# Patient Record
Sex: Male | Born: 1970 | Race: White | Hispanic: No | Marital: Single | Smoking: Never smoker
Health system: Southern US, Community
[De-identification: ages and names within clinical notes are randomized; demographics above are authoritative.]

## PROBLEM LIST (undated history)

## (undated) DIAGNOSIS — T7840XA Allergy, unspecified, initial encounter: Secondary | ICD-10-CM

## (undated) DIAGNOSIS — Z789 Other specified health status: Secondary | ICD-10-CM

## (undated) HISTORY — PX: WRIST SURGERY: SHX841

## (undated) HISTORY — DX: Allergy, unspecified, initial encounter: T78.40XA

---

## 1998-01-01 ENCOUNTER — Emergency Department (HOSPITAL_COMMUNITY): Admission: EM | Admit: 1998-01-01 | Discharge: 1998-01-01 | Payer: Self-pay | Admitting: Emergency Medicine

## 2000-07-19 ENCOUNTER — Emergency Department (HOSPITAL_COMMUNITY): Admission: EM | Admit: 2000-07-19 | Discharge: 2000-07-20 | Payer: Self-pay | Admitting: Emergency Medicine

## 2011-07-09 ENCOUNTER — Ambulatory Visit (INDEPENDENT_AMBULATORY_CARE_PROVIDER_SITE_OTHER): Payer: PRIVATE HEALTH INSURANCE

## 2011-07-09 DIAGNOSIS — F17200 Nicotine dependence, unspecified, uncomplicated: Secondary | ICD-10-CM

## 2011-07-09 DIAGNOSIS — R0602 Shortness of breath: Secondary | ICD-10-CM

## 2011-07-09 DIAGNOSIS — J159 Unspecified bacterial pneumonia: Secondary | ICD-10-CM

## 2011-07-15 ENCOUNTER — Ambulatory Visit (INDEPENDENT_AMBULATORY_CARE_PROVIDER_SITE_OTHER): Payer: PRIVATE HEALTH INSURANCE

## 2011-07-15 DIAGNOSIS — R059 Cough, unspecified: Secondary | ICD-10-CM

## 2011-07-15 DIAGNOSIS — J159 Unspecified bacterial pneumonia: Secondary | ICD-10-CM

## 2011-07-15 DIAGNOSIS — R05 Cough: Secondary | ICD-10-CM

## 2015-05-09 ENCOUNTER — Ambulatory Visit (INDEPENDENT_AMBULATORY_CARE_PROVIDER_SITE_OTHER): Payer: BLUE CROSS/BLUE SHIELD | Admitting: Family Medicine

## 2015-05-09 ENCOUNTER — Encounter: Payer: Self-pay | Admitting: Family Medicine

## 2015-05-09 VITALS — BP 108/66 | HR 74 | Temp 97.2°F | Ht 69.0 in | Wt 185.8 lb

## 2015-05-09 DIAGNOSIS — Z23 Encounter for immunization: Secondary | ICD-10-CM | POA: Diagnosis not present

## 2015-05-09 DIAGNOSIS — M545 Low back pain, unspecified: Secondary | ICD-10-CM

## 2015-05-09 MED ORDER — HYDROCODONE-ACETAMINOPHEN 5-325 MG PO TABS: 1.0000 | ORAL_TABLET | Freq: Four times a day (QID) | ORAL | Status: DC | PRN

## 2015-05-09 MED ORDER — CYCLOBENZAPRINE HCL 10 MG PO TABS: 10.0000 mg | ORAL_TABLET | Freq: Three times a day (TID) | ORAL | Status: DC | PRN

## 2015-05-09 NOTE — Patient Instructions (Signed)

## 2015-05-09 NOTE — Progress Notes (Signed)
   Subjective:    [name], male    DOB: Sep 25, 1970, 44 y.o.   MRN: [id number]  HPI 44 year old gentleman with neck pain. He felt pain when he turned several days ago and then the following day picked up a tool kit and felt worse pain. Pain is in his upper low back. There are no radicular symptoms or weakness in his legs. He has had similar episodes 3-5 times over the last 20 years.  There are no active problems to display for this patient.  Outpatient Encounter Prescriptions as of 05/09/2015  Medication Sig  . fluticasone (FLONASE) 50 MCG/ACT nasal spray Place 2 sprays into both nostrils daily.   No facility-administered encounter medications on file as of 05/09/2015.      Review of Systems  Constitutional: Negative.   HENT: Negative.   Respiratory: Negative.   Cardiovascular: Negative.   Gastrointestinal: Negative.   Musculoskeletal: Positive for back pain.       Objective:   Physical Exam  Constitutional: He appears well-developed and well-nourished.  Musculoskeletal:  Back: Decreased range of motion in all planes. Deep tendon reflexes are symmetric at the ankles and knees. Normal toe and heel gait.          Assessment & Plan:  1. Midline low back pain without sciatica Continue moving but avoid activities which aggravate pain. Besides, heat or ice, whichever improves symptoms. Prescriptions: Flexeril 10 mg 3 times a day (cautioned about drowsiness) and hydrocodone 10/25/2023 as needed. He will return if symptoms worsen or do not improve  Wardell Honour MD

## 2015-05-15 ENCOUNTER — Telehealth: Payer: Self-pay | Admitting: Family Medicine

## 2015-05-15 NOTE — Telephone Encounter (Signed)
Pt is aware Dr.Miller is out of the office until Wednesday morning.

## 2015-05-17 ENCOUNTER — Telehealth: Payer: Self-pay | Admitting: Family Medicine

## 2015-05-17 NOTE — Telephone Encounter (Signed)
Okay noted for 1 additional week. If no better needs to be re-checked

## 2015-05-17 NOTE — Telephone Encounter (Signed)
Patient tried going back to work on Monday 11/21 but was unable to stay.  Has been out of work all week.  Wants to know if he can have a work note for 05/15/15 through 05/19/15 and just go back next Monday.  If he's not any better at that time he will come back to see you.  Please advise.

## 2015-05-17 NOTE — Telephone Encounter (Signed)
Note placed up front per Dr. Hyacinth MeekerMiller, patient informed

## 2015-07-21 NOTE — Telephone Encounter (Signed)
Multiple attempts were made to reach pt. Will close encounter.

## 2015-11-10 ENCOUNTER — Ambulatory Visit (INDEPENDENT_AMBULATORY_CARE_PROVIDER_SITE_OTHER): Payer: BLUE CROSS/BLUE SHIELD

## 2015-11-10 ENCOUNTER — Encounter: Payer: Self-pay | Admitting: Physician Assistant

## 2015-11-10 ENCOUNTER — Ambulatory Visit (INDEPENDENT_AMBULATORY_CARE_PROVIDER_SITE_OTHER): Payer: BLUE CROSS/BLUE SHIELD | Admitting: Physician Assistant

## 2015-11-10 VITALS — BP 115/69 | HR 84 | Temp 97.6°F | Ht 69.0 in | Wt 186.4 lb

## 2015-11-10 DIAGNOSIS — M25571 Pain in right ankle and joints of right foot: Secondary | ICD-10-CM

## 2015-11-10 NOTE — Patient Instructions (Signed)
Ankle Sprain  An ankle sprain is an injury to the strong, fibrous tissues (ligaments) that hold the bones of your ankle joint together.   CAUSES  An ankle sprain is usually caused by a fall or by twisting your ankle. Ankle sprains most commonly occur when you step on the outer edge of your foot, and your ankle turns inward. People who participate in sports are more prone to these types of injuries.   SYMPTOMS    Pain in your ankle. The pain may be present at rest or only when you are trying to stand or walk.   Swelling.   Bruising. Bruising may develop immediately or within 1 to 2 days after your injury.   Difficulty standing or walking, particularly when turning corners or changing directions.  DIAGNOSIS   Your caregiver will ask you details about your injury and perform a physical exam of your ankle to determine if you have an ankle sprain. During the physical exam, your caregiver will press on and apply pressure to specific areas of your foot and ankle. Your caregiver will try to move your ankle in certain ways. An X-ray exam may be done to be sure a bone was not broken or a ligament did not separate from one of the bones in your ankle (avulsion fracture).   TREATMENT   Certain types of braces can help stabilize your ankle. Your caregiver can make a recommendation for this. Your caregiver may recommend the use of medicine for pain. If your sprain is severe, your caregiver may refer you to a surgeon who helps to restore function to parts of your skeletal system (orthopedist) or a physical therapist.  HOME CARE INSTRUCTIONS    Apply ice to your injury for 1-2 days or as directed by your caregiver. Applying ice helps to reduce inflammation and pain.    Put ice in a plastic bag.    Place a towel between your skin and the bag.    Leave the ice on for 15-20 minutes at a time, every 2 hours while you are awake.   Only take over-the-counter or prescription medicines for pain, discomfort, or fever as directed by  your caregiver.   Elevate your injured ankle above the level of your heart as much as possible for 2-3 days.   If your caregiver recommends crutches, use them as instructed. Gradually put weight on the affected ankle. Continue to use crutches or a cane until you can walk without feeling pain in your ankle.   If you have a plaster splint, wear the splint as directed by your caregiver. Do not rest it on anything harder than a pillow for the first 24 hours. Do not put weight on it. Do not get it wet. You may take it off to take a shower or bath.   You may have been given an elastic bandage to wear around your ankle to provide support. If the elastic bandage is too tight (you have numbness or tingling in your foot or your foot becomes cold and blue), adjust the bandage to make it comfortable.   If you have an air splint, you may blow more air into it or let air out to make it more comfortable. You may take your splint off at night and before taking a shower or bath. Wiggle your toes in the splint several times per day to decrease swelling.  SEEK MEDICAL CARE IF:    You have rapidly increasing bruising or swelling.   Your toes feel   extremely cold or you lose feeling in your foot.   Your pain is not relieved with medicine.  SEEK IMMEDIATE MEDICAL CARE IF:   Your toes are numb or blue.   You have severe pain that is increasing.  MAKE SURE YOU:    Understand these instructions.   Will watch your condition.   Will get help right away if you are not doing well or get worse.     This information is not intended to replace advice given to you by your health care provider. Make sure you discuss any questions you have with your health care provider.     Document Released: 06/10/2005 Document Revised: 07/01/2014 Document Reviewed: 06/22/2011  [employer]vier Interactive Patient Education 2016 [employer]
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
## 2015-11-14 ENCOUNTER — Other Ambulatory Visit: Payer: Self-pay | Admitting: *Deleted

## 2015-11-14 DIAGNOSIS — M25571 Pain in right ankle and joints of right foot: Secondary | ICD-10-CM

## 2015-11-15 ENCOUNTER — Telehealth: Payer: Self-pay | Admitting: Family Medicine

## 2015-11-15 ENCOUNTER — Encounter: Payer: Self-pay | Admitting: *Deleted

## 2015-11-15 NOTE — Telephone Encounter (Signed)
Left message on VM that note is at front desk ready for pickup, note states to be out until further testing has been completed. When results come in note will be rewritten.

## 2015-11-17 ENCOUNTER — Ambulatory Visit (HOSPITAL_COMMUNITY): Admission: RE | Admit: 2015-11-17 | Payer: BLUE CROSS/BLUE SHIELD | Source: Ambulatory Visit

## 2015-11-27 ENCOUNTER — Telehealth: Payer: Self-pay | Admitting: Physician Assistant

## 2015-11-27 NOTE — Telephone Encounter (Signed)
Ok to write note to return to work if pt desires

## 2015-11-27 NOTE — Telephone Encounter (Signed)
Patient aware that letter is ready for pick up.

## 2017-10-20 DIAGNOSIS — L700 Acne vulgaris: Secondary | ICD-10-CM | POA: Diagnosis not present

## 2018-05-08 ENCOUNTER — Ambulatory Visit: Payer: BLUE CROSS/BLUE SHIELD | Admitting: Family Medicine

## 2018-05-08 ENCOUNTER — Encounter: Payer: Self-pay | Admitting: Physician Assistant

## 2018-05-08 ENCOUNTER — Ambulatory Visit (INDEPENDENT_AMBULATORY_CARE_PROVIDER_SITE_OTHER): Payer: 59 | Admitting: Physician Assistant

## 2018-05-08 VITALS — BP 100/57 | HR 70 | Temp 98.0°F | Ht 69.0 in | Wt 196.0 lb

## 2018-05-08 DIAGNOSIS — S39012A Strain of muscle, fascia and tendon of lower back, initial encounter: Secondary | ICD-10-CM | POA: Diagnosis not present

## 2018-05-08 NOTE — Patient Instructions (Signed)

## 2018-05-08 NOTE — Progress Notes (Signed)
BP (!) 100/57   Pulse 70   Temp 98 F (36.7 C) (Oral)   Ht 5\' 9"  (1.753 m)   Wt 88.9 kg   BMI 28.94 kg/m    Subjective:    [name], male    DOB: June 25, 1970, 47 y.o.   MRN: [id number]  HPI: Steven Cherry is a 47 y.o. male presenting on 05/08/2018 for Back Pain   Back started 2 months ago as a dull aching in the lower back worse in the mornings and would improve throughout the day. Patient denies any injury or trauma to the area. Pain was bearable at that point. 4 days ago patient was moving boxes and felt his "back went out". He felt a sharp severe pain in the lower back and was unable to move due to the pain. 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 He says his back feels tight and has been trying to stretch daily. He has been taking ibuprofen which has been helping with the pain. The pain has improved over the last 2 days and patient is needing a doctors clearance to go back to work. He denies fevers, chest pain, sob, weakness, sensory changes, swelling, or bowel/bladder changes.    Relevant past medical, surgical, family and social history reviewed and updated as indicated. Interim medical history since our last visit reviewed. Allergies and medications reviewed and updated.  Review of Systems  Constitutional: Positive for activity change. Negative for chills and fever.  HENT: Negative.   Eyes: Negative.   Respiratory: Negative for shortness of breath.   Cardiovascular: Negative.  Negative for chest pain.  Gastrointestinal: Negative.   Genitourinary: Negative.   Musculoskeletal: Positive for back pain.  Skin: Negative.   Neurological: Negative for weakness and numbness.    Per HPI unless specifically indicated above   Allergies as of 05/08/2018   No Known Allergies     Medication List        Accurate as of 05/08/18  2:57 PM. Always use your most recent med list.          fluticasone 50 MCG/ACT  nasal spray Commonly known as:  FLONASE Place 2 sprays into both nostrils daily.          Objective:    BP (!) 100/57   Pulse 70   Temp 98 F (36.7 C) (Oral)   Ht 5\' 9"  (1.753 m)   Wt 88.9 kg   BMI 28.94 kg/m   Wt Readings from Last 3 Encounters:  05/08/18 88.9 kg  11/10/15 84.6 kg  05/09/15 84.3 kg    Physical Exam  Constitutional: He is oriented to person, place, and time. He appears well-developed and well-nourished. No distress.  HENT:  Head: Normocephalic and atraumatic.  Eyes: Conjunctivae are normal.  Cardiovascular: Normal rate, regular rhythm, normal heart sounds and intact distal pulses.  Pulmonary/Chest: Effort normal and breath sounds normal.  Musculoskeletal: He exhibits no edema, tenderness or deformity.  Neurological: He is alert and oriented to person, place, and time. No sensory deficit.  Psychiatric: He has a normal mood and affect.    No results found for this or any previous visit.    Assessment & Plan:   Problem List Items Addressed This Visit    None     Lower back strain Patient presents with 4 days of improving lower back pain. Patient was moving boxes Monday and felt his back  went out. 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 He has been taking ibuprofen for pain and doing stretches. I gave patient back exercise handout to perform daily. I recommend patient continue with ibuprofen as needed for inflammation. I will give a not for patient to start work again on Monday.   Follow up plan: No follow-ups on file.  Counseling provided for all of the vaccine components No orders of the defined types were placed in this encounter.   Prudy FeelerAngel Jones, PA-C [facility] 05/08/2018, 2:57 PM

## 2018-07-05 ENCOUNTER — Encounter (HOSPITAL_COMMUNITY): Payer: Self-pay | Admitting: Emergency Medicine

## 2018-07-05 ENCOUNTER — Emergency Department (HOSPITAL_COMMUNITY): Payer: 59

## 2018-07-05 ENCOUNTER — Emergency Department (HOSPITAL_COMMUNITY)
Admission: EM | Admit: 2018-07-05 | Discharge: 2018-07-05 | Disposition: A | Discharge status: Home / Self Care | Payer: 59 | Attending: Emergency Medicine | Admitting: Emergency Medicine

## 2018-07-05 ENCOUNTER — Other Ambulatory Visit: Payer: Self-pay

## 2018-07-05 DIAGNOSIS — Y929 Unspecified place or not applicable: Secondary | ICD-10-CM | POA: Insufficient documentation

## 2018-07-05 DIAGNOSIS — R109 Unspecified abdominal pain: Secondary | ICD-10-CM | POA: Diagnosis not present

## 2018-07-05 DIAGNOSIS — S3993XA Unspecified injury of pelvis, initial encounter: Secondary | ICD-10-CM | POA: Diagnosis not present

## 2018-07-05 DIAGNOSIS — S2242XA Multiple fractures of ribs, left side, initial encounter for closed fracture: Secondary | ICD-10-CM | POA: Diagnosis not present

## 2018-07-05 DIAGNOSIS — S3991XA Unspecified injury of abdomen, initial encounter: Secondary | ICD-10-CM | POA: Insufficient documentation

## 2018-07-05 DIAGNOSIS — Y9389 Activity, other specified: Secondary | ICD-10-CM | POA: Insufficient documentation

## 2018-07-05 DIAGNOSIS — S0990XA Unspecified injury of head, initial encounter: Secondary | ICD-10-CM | POA: Diagnosis not present

## 2018-07-05 DIAGNOSIS — S199XXA Unspecified injury of neck, initial encounter: Secondary | ICD-10-CM | POA: Diagnosis not present

## 2018-07-05 DIAGNOSIS — Y999 Unspecified external cause status: Secondary | ICD-10-CM | POA: Insufficient documentation

## 2018-07-05 DIAGNOSIS — S4992XA Unspecified injury of left shoulder and upper arm, initial encounter: Secondary | ICD-10-CM | POA: Diagnosis not present

## 2018-07-05 DIAGNOSIS — S299XXA Unspecified injury of thorax, initial encounter: Secondary | ICD-10-CM | POA: Diagnosis not present

## 2018-07-05 HISTORY — DX: Other specified health status: Z78.9

## 2018-07-05 LAB — COMPREHENSIVE METABOLIC PANEL
ALT: 28 U/L (ref 0–44)
AST: 28 U/L (ref 15–41)
Albumin: 3.8 g/dL (ref 3.5–5.0)
Alkaline Phosphatase: 28 U/L — ABNORMAL LOW (ref 38–126)
Anion gap: 9 (ref 5–15)
BILIRUBIN TOTAL: 1.1 mg/dL (ref 0.3–1.2)
BUN: 16 mg/dL (ref 6–20)
CO2: 23 mmol/L (ref 22–32)
Calcium: 9.2 mg/dL (ref 8.9–10.3)
Chloride: 108 mmol/L (ref 98–111)
Creatinine, Ser: 1.09 mg/dL (ref 0.61–1.24)
GFR calc Af Amer: >60 mL/min (ref >60)
Glucose, Bld: 88 mg/dL (ref 70–99)
Potassium: 3.5 mmol/L (ref 3.5–5.1)
Sodium: 140 mmol/L (ref 135–145)
Total Protein: 5.9 g/dL — ABNORMAL LOW (ref 6.5–8.1)

## 2018-07-05 LAB — I-STAT CHEM 8, ED
BUN: 19 mg/dL (ref 6–20)
Calcium, Ion: 1.12 mmol/L — ABNORMAL LOW (ref 1.15–1.40)
Chloride: 106 mmol/L (ref 98–111)
Creatinine, Ser: 1 mg/dL (ref 0.61–1.24)
Glucose, Bld: 84 mg/dL (ref 70–99)
HCT: 42 % (ref 39.0–52.0)
Hemoglobin: 14.3 g/dL (ref 13.0–17.0)
Potassium: 3.5 mmol/L (ref 3.5–5.1)
Sodium: 140 mmol/L (ref 135–145)
TCO2: 23 mmol/L (ref 22–32)

## 2018-07-05 LAB — URINALYSIS, ROUTINE W REFLEX MICROSCOPIC
Bilirubin Urine: NEGATIVE
Glucose, UA: NEGATIVE mg/dL
Hgb urine dipstick: NEGATIVE
Ketones, ur: NEGATIVE mg/dL
Leukocytes, UA: NEGATIVE
NITRITE: NEGATIVE
Protein, ur: NEGATIVE mg/dL
Specific Gravity, Urine: 1.024 (ref 1.005–1.030)
pH: 6 (ref 5.0–8.0)

## 2018-07-05 LAB — PROTIME-INR
INR: 0.93
Prothrombin Time: 12.4 seconds (ref 11.4–15.2)

## 2018-07-05 LAB — CBC
HCT: 44.8 % (ref 39.0–52.0)
Hemoglobin: 15.2 g/dL (ref 13.0–17.0)
MCH: 30.2 pg (ref 26.0–34.0)
MCHC: 33.9 g/dL (ref 30.0–36.0)
MCV: 89.1 fL (ref 80.0–100.0)
NRBC: 0 % (ref 0.0–0.2)
Platelets: 172 10*3/uL (ref 150–400)
RBC: 5.03 MIL/uL (ref 4.22–5.81)
RDW: 11.9 % (ref 11.5–15.5)
WBC: 8.6 10*3/uL (ref 4.0–10.5)

## 2018-07-05 LAB — SAMPLE TO BLOOD BANK

## 2018-07-05 LAB — ETHANOL: Alcohol, Ethyl (B): <10 mg/dL (ref <10)

## 2018-07-05 LAB — I-STAT CG4 LACTIC ACID, ED: Lactic Acid, Venous: 1.13 mmol/L (ref 0.5–1.9)

## 2018-07-05 LAB — CDS SEROLOGY

## 2018-07-05 MED ORDER — ACETAMINOPHEN 500 MG PO TABS
1000.0000 mg | ORAL_TABLET | Freq: Once | ORAL | Status: AC
  Administered 2018-07-05: 1000 mg via ORAL
  Filled 2018-07-05: qty 2

## 2018-07-05 MED ORDER — TETANUS-DIPHTH-ACELL PERTUSSIS 5-2.5-18.5 LF-MCG/0.5 IM SUSP
0.5000 mL | Freq: Once | INTRAMUSCULAR | Status: DC
  Filled 2018-07-05: qty 0.5

## 2018-07-05 MED ORDER — HYDROMORPHONE HCL 1 MG/ML IJ SOLN
1.0000 mg | Freq: Once | INTRAMUSCULAR | Status: AC
  Administered 2018-07-05: 1 mg via INTRAVENOUS
  Filled 2018-07-05: qty 1

## 2018-07-05 MED ORDER — IOHEXOL 300 MG/ML  SOLN
100.0000 mL | Freq: Once | INTRAMUSCULAR | Status: AC | PRN
  Administered 2018-07-05: 100 mL via INTRAVENOUS
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
## 2018-07-05 NOTE — Progress Notes (Signed)
Orthopedic Tech Progress Note Patient Details:  Steven Cherry [birth year] [id number]  [name], male   DOB: 06-Mar-1971, 48 y.o.   MRN: [id number]   [name] 07/05/2018, 5:28 PMLevel 2 Trauma.

## 2018-07-05 NOTE — ED Notes (Signed)
Patient transported to CT 
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
## 2018-07-05 NOTE — ED Notes (Signed)
Patient transported to X-ray 

## 2018-07-06 ENCOUNTER — Encounter: Payer: Self-pay | Admitting: Physician Assistant

## 2018-07-16 ENCOUNTER — Telehealth: Payer: Self-pay | Admitting: Family Medicine

## 2018-07-16 NOTE — Telephone Encounter (Signed)
appt scheduled Pt notified 

## 2018-07-17 ENCOUNTER — Ambulatory Visit (INDEPENDENT_AMBULATORY_CARE_PROVIDER_SITE_OTHER): Payer: 59 | Admitting: Family Medicine

## 2018-07-17 ENCOUNTER — Encounter: Payer: Self-pay | Admitting: Family Medicine

## 2018-07-17 VITALS — BP 122/81 | HR 74 | Temp 97.2°F | Ht 69.0 in | Wt 198.0 lb

## 2018-07-17 DIAGNOSIS — J342 Deviated nasal septum: Secondary | ICD-10-CM | POA: Insufficient documentation

## 2018-07-17 DIAGNOSIS — J3089 Other allergic rhinitis: Secondary | ICD-10-CM | POA: Diagnosis not present

## 2018-07-17 DIAGNOSIS — S2242XA Multiple fractures of ribs, left side, initial encounter for closed fracture: Secondary | ICD-10-CM | POA: Insufficient documentation

## 2018-07-17 DIAGNOSIS — M25512 Pain in left shoulder: Secondary | ICD-10-CM | POA: Diagnosis not present

## 2018-07-17 DIAGNOSIS — S2242XD Multiple fractures of ribs, left side, subsequent encounter for fracture with routine healing: Secondary | ICD-10-CM

## 2018-07-17 MED ORDER — CYCLOBENZAPRINE HCL 10 MG PO TABS: 10.0000 mg | ORAL_TABLET | Freq: Three times a day (TID) | ORAL | 1 refills | Status: DC | PRN

## 2018-07-17 MED ORDER — AZELASTINE HCL 0.1 % NA SOLN: 2.0000 | Freq: Two times a day (BID) | NASAL | 12 refills | Status: DC

## 2018-07-17 MED ORDER — DICLOFENAC SODIUM 75 MG PO TBEC: 75.0000 mg | DELAYED_RELEASE_TABLET | Freq: Two times a day (BID) | ORAL | 1 refills | Status: DC | PRN

## 2018-07-17 NOTE — Patient Instructions (Addendum)
I have referred you to [facility] in [place]. I have sent you in diclofenac that you may take twice daily with a meal and plenty of water for left shoulder pain and inflammation.  You have also been prescribed a muscle relaxer that you may use up to 3 times daily if needed.  Be cautious that this can cause sedation.  Do not operate heavy machinery with this medicine.  Plan to see me again in 1 month for recheck and possible release to work.  With regards to your allergy symptoms.  I recommend you obtain Xyzal and take 1 tablet every night at bedtime.  He may continue the Nasacort.  If you do not notice substantial improvement in your symptoms within the next week or so, he can hold the Nasacort and start the Astelin nasal spray that I have sent to the pharmacy for you.  If you have persistent symptoms despite these treatments, and decide that you want to go to the ear nose and throat for the deviated septum contact me and I will place the referral.
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
## 2018-08-03 DIAGNOSIS — M7582 Other shoulder lesions, left shoulder: Secondary | ICD-10-CM | POA: Diagnosis not present

## 2018-08-03 DIAGNOSIS — M5412 Radiculopathy, cervical region: Secondary | ICD-10-CM | POA: Diagnosis not present

## 2018-08-12 DIAGNOSIS — M25512 Pain in left shoulder: Secondary | ICD-10-CM | POA: Diagnosis not present

## 2018-08-12 DIAGNOSIS — M542 Cervicalgia: Secondary | ICD-10-CM | POA: Diagnosis not present

## 2018-08-14 DIAGNOSIS — M542 Cervicalgia: Secondary | ICD-10-CM | POA: Diagnosis not present

## 2018-08-14 DIAGNOSIS — M25512 Pain in left shoulder: Secondary | ICD-10-CM | POA: Diagnosis not present

## 2018-08-17 ENCOUNTER — Ambulatory Visit: Payer: 59 | Admitting: Family Medicine

## 2018-08-27 ENCOUNTER — Encounter: Payer: Self-pay | Admitting: Family Medicine

## 2018-09-03 DIAGNOSIS — Z6829 Body mass index (BMI) 29.0-29.9, adult: Secondary | ICD-10-CM | POA: Diagnosis not present

## 2018-09-03 DIAGNOSIS — M5412 Radiculopathy, cervical region: Secondary | ICD-10-CM | POA: Diagnosis not present

## 2020-02-01 ENCOUNTER — Emergency Department (HOSPITAL_COMMUNITY)
Admission: EM | Admit: 2020-02-01 | Discharge: 2020-02-02 | Disposition: A | Discharge status: Home / Self Care | Payer: Managed Care, Other (non HMO) | Attending: Emergency Medicine | Admitting: Emergency Medicine

## 2020-02-01 ENCOUNTER — Encounter (HOSPITAL_COMMUNITY): Payer: Self-pay | Admitting: Pharmacy Technician

## 2020-02-01 ENCOUNTER — Emergency Department (HOSPITAL_COMMUNITY): Payer: Managed Care, Other (non HMO)

## 2020-02-01 ENCOUNTER — Encounter: Payer: Self-pay | Admitting: Nurse Practitioner

## 2020-02-01 ENCOUNTER — Ambulatory Visit (INDEPENDENT_AMBULATORY_CARE_PROVIDER_SITE_OTHER): Payer: Managed Care, Other (non HMO) | Admitting: Nurse Practitioner

## 2020-02-01 ENCOUNTER — Other Ambulatory Visit: Payer: Self-pay

## 2020-02-01 VITALS — BP 96/60 | HR 67 | Temp 98.3°F | Resp 22 | Ht 69.0 in | Wt 194.0 lb

## 2020-02-01 DIAGNOSIS — R55 Syncope and collapse: Secondary | ICD-10-CM | POA: Diagnosis not present

## 2020-02-01 DIAGNOSIS — Z5321 Procedure and treatment not carried out due to patient leaving prior to being seen by health care provider: Secondary | ICD-10-CM | POA: Insufficient documentation

## 2020-02-01 DIAGNOSIS — R0602 Shortness of breath: Secondary | ICD-10-CM | POA: Insufficient documentation

## 2020-02-01 DIAGNOSIS — Z1152 Encounter for screening for COVID-19: Secondary | ICD-10-CM

## 2020-02-01 LAB — BASIC METABOLIC PANEL
Anion gap: 12 (ref 5–15)
BUN: 18 mg/dL (ref 6–20)
CO2: 25 mmol/L (ref 22–32)
Calcium: 9.3 mg/dL (ref 8.9–10.3)
Chloride: 105 mmol/L (ref 98–111)
Creatinine, Ser: 1.26 mg/dL — ABNORMAL HIGH (ref 0.61–1.24)
GFR calc Af Amer: >60 mL/min (ref >60)
GFR calc non Af Amer: >60 mL/min (ref >60)
Glucose, Bld: 100 mg/dL — ABNORMAL HIGH (ref 70–99)
Potassium: 3.7 mmol/L (ref 3.5–5.1)
Sodium: 142 mmol/L (ref 135–145)

## 2020-02-01 LAB — CBC
HCT: 47.7 % (ref 39.0–52.0)
Hemoglobin: 16.1 g/dL (ref 13.0–17.0)
MCH: 29.1 pg (ref 26.0–34.0)
MCHC: 33.8 g/dL (ref 30.0–36.0)
MCV: 86.1 fL (ref 80.0–100.0)
Platelets: 267 10*3/uL (ref 150–400)
RBC: 5.54 MIL/uL (ref 4.22–5.81)
RDW: 12.5 % (ref 11.5–15.5)
WBC: 8.8 10*3/uL (ref 4.0–10.5)
nRBC: 0 % (ref 0.0–0.2)

## 2020-02-01 LAB — URINALYSIS, ROUTINE W REFLEX MICROSCOPIC
Bilirubin Urine: NEGATIVE
Glucose, UA: NEGATIVE mg/dL
Hgb urine dipstick: NEGATIVE
Ketones, ur: NEGATIVE mg/dL
Leukocytes,Ua: NEGATIVE
Nitrite: NEGATIVE
Protein, ur: NEGATIVE mg/dL
Specific Gravity, Urine: 1.03 (ref 1.005–1.030)
pH: 5 (ref 5.0–8.0)

## 2020-02-01 NOTE — Patient Instructions (Signed)
     Syncope Syncope is when you pass out (faint) for a short time. It is caused by a sudden decrease in blood flow to the brain. Signs that you may be about to pass out include:  Feeling dizzy or light-headed.  Feeling sick to your stomach (nauseous).  Seeing all white or all black.  Having cold, clammy skin. If you pass out, get help right away. Call your local emergency services (911 in the [place] Do not drive yourself to the hospital. Follow these instructions at home: Watch for any changes in your symptoms. Take these actions to stay safe and help with your symptoms: Lifestyle  Do not drive, use machinery, or play sports until your doctor says it is okay.  Do not drink alcohol.  Do not use any products that contain nicotine or tobacco, such as cigarettes and e-cigarettes. If you need help quitting, ask your doctor.  Drink enough fluid to keep your pee (urine) pale yellow. General instructions  Take over-the-counter and prescription medicines only as told by your doctor.  If you are taking blood pressure or heart medicine, sit up and stand up slowly. Spend a few minutes getting ready to sit and then stand. This can help you feel less dizzy.  Have someone stay with you until you feel stable.  If you start to feel like you might pass out, lie down right away and raise (elevate) your feet above the level of your heart. Breathe deeply and steadily. Wait until all of the symptoms are gone.  Keep all follow-up visits as told by your doctor. This is important. Get help right away if:  You have a very bad headache.  You pass out once or more than once.  You have pain in your chest, belly, or back.  You have a very fast or uneven heartbeat (palpitations).  It hurts to breathe.  You are bleeding from your mouth or your bottom (rectum).  You have black or tarry poop (stool).  You have jerky movements that you cannot control (seizure).  You are confused.  You have  trouble walking.  You are very weak.  You have vision problems. These symptoms may be an emergency. Do not wait to see if the symptoms will go away. Get medical help right away. Call your local emergency services (911 in the [place] Do not drive yourself to the hospital. Summary  Syncope is when you pass out (faint) for a short time. It is caused by a sudden decrease in blood flow to the brain.  Signs that you may be about to faint include feeling dizzy, light-headed, or sick to your stomach, seeing all white or all black, or having cold, clammy skin.  If you start to feel like you might pass out, lie down right away and raise (elevate) your feet above the level of your heart. Breathe deeply and steadily. Wait until all of the symptoms are gone. This information is not intended to replace advice given to you by your health care provider. Make sure you discuss any questions you have with your health care provider. Document Revised: 07/23/2017 Document Reviewed: 07/23/2017 Elsevier Patient Education  2020 [employer]  

## 2020-02-01 NOTE — Assessment & Plan Note (Signed)
Patient is presenting with signs and symptoms of Covid-19.  Patient is reporting tiredness, fatigue, some diarrhea, lightheaded for few days and shortness of breath. Patient denies fever, nausea /vomiting, cough or congestion.  Symptoms started on Sunday.  Covid swab collected, results pending. Advised patient to go to the ED due to syncopal episode last night.

## 2020-02-01 NOTE — Assessment & Plan Note (Signed)
Patient is a 49 year old male who presents to clinic today for syncopal episode that occurred yesterday 01/31/2020.  Patient is unable to tell exactly what happened but he found himself on the bathroom floor. This is new for patient and the first occurrence.  Patient is reporting increased fatigue, tiredness, one episode of diarrhea, lightheadedness for few days with shortness of breath.  Blood pressure today is 96/60  lower than patient's normal limits.  Patient is not experiencing any fever, cough or congestion. Due to COVID-19 symptoms no EKGs completed.  Patient advised to go to ED for cardiac work-up.  Advised patient to find help driving to ED or EMS will be called, patient opted to drive self to emergency department.

## 2020-02-01 NOTE — ED Triage Notes (Signed)
Pt arrives via pov from PCP with concern for PE. Pt reports syncopal episode yesterday and has been short of breath for a few days.

## 2020-02-01 NOTE — Progress Notes (Signed)
Acute Office Visit  Subjective:    [name], male    DOB: Jul 19, 1970, 49 y.o.   MRN: [id number]  Chief Complaint  Patient presents with  . Dizziness    lightheaded, fatigue, diarrhea, sob at times = since Sunday     Loss of Consciousness This is a new (one time incedence ) problem. The current episode started yesterday. The problem has been resolved. He lost consciousness for a period of 1 to 5 minutes. The symptoms are aggravated by unknown factors. Associated symptoms include headaches and light-headedness. Pertinent negatives include no dizziness. Associated symptoms comments: Diarrhea, shortness of breath . He has tried nothing for the symptoms.    Past Medical History:  Diagnosis Date  . Allergy    seasonal  . Patient denies medical problems     Past Surgical History:  Procedure Laterality Date  . WRIST SURGERY Left    tendons repaired 20 yrs ago      Social History   Socioeconomic History  . Marital status: Single    Spouse name: Not on file  . Number of children: Not on file  . Years of education: Not on file  . Highest education level: Not on file  Occupational History  . Not on file  Tobacco Use  . Smoking status: Never Smoker  . Smokeless tobacco: Never Used  Substance and Sexual Activity  . Alcohol use: Never    Comment: occasional  . Drug use: Never  . Sexual activity: Not on file  Other Topics Concern  . Not on file  Social History Narrative   ** Merged History Encounter **       Social Determinants of Health   Financial Resource Strain:   . Difficulty of Paying Living Expenses:   Food Insecurity:   . Worried About Programme researcher, broadcasting/film/video in the Last Year:   . Barista in the Last Year:   Transportation Needs:   . Freight forwarder (Medical):   Marland Kitchen Lack of Transportation (Non-Medical):   Physical Activity:   . Days of Exercise per Week:   . Minutes of Exercise per Session:   Stress:   . Feeling of Stress :     Social Connections:   . Frequency of Communication with Friends and Family:   . Frequency of Social Gatherings with Friends and Family:   . Attends Religious Services:   . Active Member of Clubs or Organizations:   . Attends Banker Meetings:   Marland Kitchen Marital Status:   Intimate Partner Violence:   . Fear of Current or Ex-Partner:   . Emotionally Abused:   Marland Kitchen Physically Abused:   . Sexually Abused:     Outpatient Medications Prior to Visit  Medication Sig Dispense Refill  . azelastine (ASTELIN) 0.1 % nasal spray Place 2 sprays into both nostrils 2 (two) times daily. Use in each nostril as directed 30 mL 12  . cyclobenzaprine (FLEXERIL) 10 MG tablet Take 1 tablet (10 mg total) by mouth 3 (three) times daily as needed for muscle spasms. 30 tablet 1  . diclofenac (VOLTAREN) 75 MG EC tablet Take 1 tablet (75 mg total) by mouth 2 (two) times daily as needed for moderate pain. 30 tablet 1  . fluticasone (FLONASE) 50 MCG/ACT nasal spray Place 2 sprays into both nostrils daily.    Marland Kitchen triamcinolone (NASACORT ALLERGY 24HR) 55 MCG/ACT AERO nasal inhaler Place 2 sprays into the nose daily.     No  facility-administered medications prior to visit.    No Known Allergies  Review of Systems  Constitutional: Negative.   HENT: Negative.   Eyes: Negative.   Respiratory: Negative.   Cardiovascular: Positive for syncope.  Gastrointestinal: Negative.   Genitourinary: Negative.   Musculoskeletal: Negative.   Skin: Negative.   Neurological: Positive for light-headedness and headaches. Negative for dizziness.       Syncopal event prior to clinic visit        Objective:    Physical Exam Vitals reviewed.  Constitutional:      Appearance: Normal appearance.  HENT:     Head: Normocephalic.  Eyes:     Conjunctiva/sclera: Conjunctivae normal.  Cardiovascular:     Rate and Rhythm: Normal rate and regular rhythm.     Pulses: Normal pulses.     Heart sounds: Normal heart sounds.   Pulmonary:     Breath sounds: Normal breath sounds.  Abdominal:     General: Bowel sounds are normal.  Neurological:     Mental Status: He is alert and oriented to person, place, and time.     Motor: No weakness.     Comments: Syncopal event     BP 96/60   Pulse 67   Temp 98.3 F (36.8 C)   Resp (!) 22   Ht 5\' 9"  (1.753 m)   Wt 194 lb (88 kg)   SpO2 95%   BMI 28.65 kg/m  Wt Readings from Last 3 Encounters:  07/17/18 198 lb (89.8 kg)  07/05/18 190 lb (86.2 kg)  05/08/18 196 lb (88.9 kg)    Health Maintenance Due  Topic Date Due  . Hepatitis C Screening  Never done  . COVID-19 Vaccine (1) Never done  . HIV Screening  Never done  . TETANUS/TDAP  06/24/2016  . COLONOSCOPY  Never done    There are no preventive care reminders to display for this patient.   No results found for: TSH Lab Results  Component Value Date   WBC 8.6 07/05/2018   HGB 14.3 07/05/2018   HCT 42.0 07/05/2018   MCV 89.1 07/05/2018   PLT 172 07/05/2018   Lab Results  Component Value Date   NA 140 07/05/2018   K 3.5 07/05/2018   CO2 23 07/05/2018   GLUCOSE 84 07/05/2018   BUN 19 07/05/2018   CREATININE 1.00 07/05/2018   BILITOT 1.1 07/05/2018   ALKPHOS 28 (L) 07/05/2018   AST 28 07/05/2018   ALT 28 07/05/2018   PROT 5.9 (L) 07/05/2018   ALBUMIN 3.8 07/05/2018   CALCIUM 9.2 07/05/2018   ANIONGAP 9 07/05/2018       Assessment & Plan:  Encounter for screening for COVID-19 Patient is presenting with signs and symptoms of Covid-19.  Patient is reporting tiredness, fatigue, some diarrhea, lightheaded for few days and shortness of breath. Patient denies fever, nausea /vomiting, cough or congestion.  Symptoms started on Sunday.  Covid swab collected, results pending. Advised patient to go to the ED due to syncopal episode last night.  Syncope Patient is a 49 year old male who presents to clinic today for syncopal episode that occurred yesterday 01/31/2020.  Patient is unable to tell  exactly what happened but he found himself on the bathroom floor. This is new for patient and the first occurrence.  Patient is reporting increased fatigue, tiredness, one episode of diarrhea, lightheadedness for few days with shortness of breath.  Blood pressure today is 96/60  lower than patient's normal limits.  Patient is not experiencing  any fever, cough or congestion. Due to COVID-19 symptoms no EKGs completed.  Patient advised to go to ED for cardiac work-up.  Advised patient to find help driving to ED or EMS will be called, patient opted to drive self to emergency department.  Problem List Items Addressed This Visit      Cardiovascular and Mediastinum   Near syncope - Primary   Relevant Orders   EKG 12-Lead       No orders of the defined types were placed in this encounter.    Daryll Drown, NP

## 2020-02-02 LAB — SARS-COV-2, NAA 2 DAY TAT

## 2020-02-02 LAB — NOVEL CORONAVIRUS, NAA: SARS-CoV-2, NAA: NOT DETECTED

## 2020-02-02 NOTE — Progress Notes (Signed)
Pt called for VS x3. No answer  

## 2020-02-03 ENCOUNTER — Telehealth: Payer: Self-pay | Admitting: Family Medicine

## 2020-02-03 NOTE — Telephone Encounter (Signed)
Patient was seen in our office on 02/01/2020.  He has not missed any work.  He needs a note stating that he is cleared to go back to work without any days missed.

## 2020-02-14 NOTE — Telephone Encounter (Signed)
This may be a little late or I may have already addressed it then ignore.  Message was sent 02/03/2020 for patient 's permission or work release note please clarify if patient is still wanting permission/release from work note thank you.

## 2020-02-15 NOTE — Telephone Encounter (Signed)
Okay for note

## 2020-02-15 NOTE — Telephone Encounter (Signed)
Letter written, printed and ready for pick up. Pt aware.

## 2020-02-15 NOTE — Telephone Encounter (Signed)
Spoke with pt and he says he still needs a note to release him to go back to work. Ok for note?
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
